# Patient Record
Sex: Male | Born: 1980 | Race: Black or African American | Hispanic: No | Marital: Single | Smoking: Never smoker
Health system: Southern US, Community
[De-identification: ages and names within clinical notes are randomized; demographics above are authoritative.]

---

## 2008-05-09 ENCOUNTER — Emergency Department: Payer: Self-pay | Admitting: Emergency Medicine

## 2010-03-22 ENCOUNTER — Emergency Department: Payer: Self-pay | Admitting: Unknown Physician Specialty

## 2010-03-27 ENCOUNTER — Emergency Department: Payer: Self-pay | Admitting: Emergency Medicine

## 2010-07-25 ENCOUNTER — Emergency Department: Payer: Self-pay | Admitting: Emergency Medicine

## 2012-06-27 DIAGNOSIS — Z3141 Encounter for fertility testing: Secondary | ICD-10-CM | POA: Insufficient documentation

## 2014-05-12 ENCOUNTER — Emergency Department: Payer: Self-pay | Admitting: Emergency Medicine
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
## 2018-11-21 ENCOUNTER — Other Ambulatory Visit: Payer: Self-pay

## 2018-11-21 DIAGNOSIS — Z20822 Contact with and (suspected) exposure to covid-19: Secondary | ICD-10-CM

## 2018-11-23 LAB — NOVEL CORONAVIRUS, NAA: SARS-CoV-2, NAA: NOT DETECTED

## 2019-01-17 ENCOUNTER — Other Ambulatory Visit: Payer: Self-pay

## 2019-01-17 DIAGNOSIS — Z20822 Contact with and (suspected) exposure to covid-19: Secondary | ICD-10-CM

## 2019-01-19 LAB — NOVEL CORONAVIRUS, NAA: SARS-CoV-2, NAA: NOT DETECTED

## 2019-01-23 ENCOUNTER — Telehealth: Payer: Self-pay

## 2019-01-23 NOTE — Telephone Encounter (Signed)
Called and informed patient that test for Covid 19 was NEGATIVE. Discussed signs and symptoms of Covid 19 : fever, chills, respiratory symptoms, cough, ENT symptoms, sore throat, SOB, muscle pain, diarrhea, headache, loss of taste/smell, close exposure to COVID-19 patient. Pt instructed to call PCP if they develop the above signs and sx. Pt also instructed to call 911 if having respiratory. Pt stated he was around someone who tested positive for Covid. Advised pt to self isolate for total if 14 days. issues/distress. Discussed MyChart enrollment. Pt verbalized understanding.

## 2021-12-27 ENCOUNTER — Ambulatory Visit
Admission: EM | Admit: 2021-12-27 | Discharge: 2021-12-27 | Disposition: A | Discharge status: Home / Self Care | Payer: BC Managed Care – PPO

## 2021-12-27 DIAGNOSIS — B084 Enteroviral vesicular stomatitis with exanthem: Secondary | ICD-10-CM

## 2021-12-27 NOTE — ED Provider Notes (Signed)
[facility]    CSN: [id number] Arrival date & time: 12/27/21  1453      History   Chief Complaint Chief Complaint  Patient presents with   Rash    HPI Shawn Miles is a 41 y.o. male presenting for red dots on his hands and feet for the past 4 days.  He also reports a mild sore throat.  No mouth sores.  No fever.  Denies congestion or cough.  He says that his son recently got diagnosed with hand-foot-and-mouth.  Patient has been on work for a few days for not feeling well and request a work note.  He has been trying to keep the rash clean and treat with over-the-counter medications.  He says that his throat is feeling better.  He has no new concerns.  No other complaints.  HPI  History reviewed. No pertinent past medical history.  There are no problems to display for this patient.   History reviewed. No pertinent surgical history.     Home Medications    Prior to Admission medications   Not on File    Family History History reviewed. No pertinent family history.  Social History Social History   Tobacco Use   Smoking status: Never   Smokeless tobacco: Never  Vaping Use   Vaping Use: Never used  Substance Use Topics   Alcohol use: Yes   Drug use: Never     Allergies   Patient has no allergy information on record.   Review of Systems Review of Systems  Constitutional:  Negative for fatigue and fever.  HENT:  Positive for sore throat. Negative for congestion, mouth sores and rhinorrhea.   Respiratory:  Negative for cough.   Musculoskeletal:  Negative for arthralgias and joint swelling.  Skin:  Positive for rash.  Neurological:  Negative for weakness.     Physical Exam Triage Vital Signs ED Triage Vitals  Enc Vitals Group     BP      Pulse      Resp      Temp      Temp src      SpO2      Weight      Height      Head Circumference      Peak Flow      Pain Score      Pain Loc      Pain Edu?      Excl. in GC?    No data  found.  Updated Vital Signs BP (!) 137/93 (BP Location: Left Arm)   Pulse 82   Temp 98 F (36.7 C) (Oral)   Resp 18   Ht 6\' 2"  (1.88 m)   Wt 250 lb (113.4 kg)   SpO2 97%   BMI 32.10 kg/m    Physical Exam Vitals and nursing note reviewed.  Constitutional:      General: He is not in acute distress.    Appearance: Normal appearance. He is well-developed. He is not ill-appearing.  HENT:     Head: Normocephalic and atraumatic.     Nose: Nose normal.     Mouth/Throat:     Mouth: Mucous membranes are moist.     Pharynx: Oropharynx is clear. Posterior oropharyngeal erythema (mild) present.  Eyes:     General: No scleral icterus.    Conjunctiva/sclera: Conjunctivae normal.  Cardiovascular:     Rate and Rhythm: Normal rate and regular rhythm.     Heart sounds: Normal heart sounds.  Pulmonary:     Effort: Pulmonary effort is normal. No respiratory distress.     Breath sounds: Normal breath sounds.  Musculoskeletal:     Cervical back: Neck supple.  Skin:    General: Skin is warm and dry.     Capillary Refill: Capillary refill takes less than 2 seconds.     Findings: Rash present.     Comments: Multiple scattered erythematous macules and papules of bilateral palmar hands and feet  Neurological:     General: No focal deficit present.     Mental Status: He is alert. Mental status is at baseline.     Motor: No weakness.     Gait: Gait normal.  Psychiatric:        Mood and Affect: Mood normal.      UC Treatments / Results  Labs (all labs ordered are listed, but only abnormal results are displayed) Labs Reviewed - No data to display  EKG   Radiology No results found.  Procedures Procedures (including critical care time)  Medications Ordered in UC Medications - No data to display  Initial Impression / Assessment and Plan / UC Course  I have reviewed the triage vital signs and the nursing notes.  Pertinent labs & imaging results that were available during my care  of the patient were reviewed by me and considered in my medical decision making (see chart for details).   41 year old male presents for rash of hands and feet for the past few days.  Also reports a sore throat.  He has been around his son who is recently diagnosed with hand-foot-and-mouth.  Vitals are stable.  He is afebrile.  On exam he has mild erythema posterior pharynx.  No mouth lesions.  He does have a few scattered erythematous papules and macules mostly of the palmar hands, some of the dorsal hands and feet.  The remainder of the exam is normal.  Suspect that he does indeed have hand-foot-and-mouth.  Reviewed supportive care.  Work note provided.  Reviewed return and ER precautions.   Final Clinical Impressions(s) / UC Diagnoses   Final diagnoses:  Hand, foot and mouth disease   Discharge Instructions   None    ED Prescriptions   None    PDMP not reviewed this encounter.   Danton Clap, PA-C 12/27/21 1621

## 2021-12-27 NOTE — ED Triage Notes (Signed)
Pt c/o hand foot and mouth x4days  Pt states he got it from his youngest son who got it from daycare.   Pt asks for a work note.

## 2022-08-10 ENCOUNTER — Ambulatory Visit
Admission: EM | Admit: 2022-08-10 | Discharge: 2022-08-10 | Disposition: A | Discharge status: Home / Self Care | Payer: BC Managed Care – PPO | Attending: Emergency Medicine | Admitting: Emergency Medicine

## 2022-08-10 DIAGNOSIS — H1033 Unspecified acute conjunctivitis, bilateral: Secondary | ICD-10-CM

## 2022-08-10 MED ORDER — MOXIFLOXACIN HCL 0.5 % OP SOLN: 1.0000 [drp] | Freq: Three times a day (TID) | OPHTHALMIC | 0 refills | Status: AC

## 2022-08-10 NOTE — Discharge Instructions (Signed)
Try OTC Opcon-A, 2 drops in each eye every 6 hours to see if they help your symptoms.  If your symptoms do not improve, or you develop mucous like discharge start the Vigamox.  Instill 1 drop of Vigamox in each eye every 8 hours for the next 7 days for treatment of your conjunctivitis.  Avoid touching your eyes as much as possible.  Wipe down all surfaces, countertops, and doorknobs after the first and second 24 hours on eyedrops.  Wash her face with a clean wash rag to remove any drainage and use a different portion of the wash rag to clean each eye so as to not reinfect yourself.  Return for reevaluation for any new or worsening symptoms.

## 2022-08-10 NOTE — ED Triage Notes (Signed)
Patient presents to UC for bilateral eye redness, light sensitivity,  and watery drainage. Treating with clear eyes, states it made it worse.

## 2022-08-10 NOTE — ED Provider Notes (Signed)
[facility]    CSN: [id number] Arrival date & time: 08/10/22  1413      History   Chief Complaint Chief Complaint  Patient presents with   Eye Problem    HPI Shawn Miles is a 42 y.o. male.   HPI  42 year old male with no significant past medical history presents for evaluation of bilateral eye redness with watery discharge that started last night with photosensitivity that started this morning.  He also reports that he had some crusting in his inner canthus but not in his eyelashes and his eyelashes were not matted shut.  He denies any injury.  He does not wear contacts.  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  History reviewed. No pertinent past medical history.  Patient Active Problem List   Diagnosis Date Noted   Fertility testing 06/27/2012    History reviewed. No pertinent surgical history.     Home Medications    Prior to Admission medications   Medication Sig Start Date End Date Taking? Authorizing Provider  moxifloxacin (VIGAMOX) 0.5 % ophthalmic solution Place 1 drop into both eyes 3 (three) times daily for 7 days. 08/10/22 08/17/22 Yes Becky Augusta, NP    Family History History reviewed. No pertinent family history.  Social History Social History   Tobacco Use   Smoking status: Never   Smokeless tobacco: Never  Vaping Use   Vaping Use: Never used  Substance Use Topics   Alcohol use: Yes   Drug use: Never     Allergies   Patient has no known allergies.   Review of Systems Review of Systems  Constitutional:  Negative for fever.  Eyes:  Positive for photophobia, pain, discharge and redness. Negative for itching and visual disturbance.     Physical Exam Triage Vital Signs ED Triage Vitals  Enc Vitals Group     BP      Pulse      Resp      Temp      Temp src      SpO2      Weight      Height      Head Circumference      Peak Flow      Pain Score      Pain Loc      Pain Edu?      Excl. in GC?    No data found.  Updated  Vital Signs BP (!) 126/102 (BP Location: Left Arm)   Pulse 91   Temp 98 F (36.7 C)   Resp 18   SpO2 99%   Visual Acuity Right Eye Distance:   Left Eye Distance:   Bilateral Distance:    Right Eye Near:   Left Eye Near:    Bilateral Near:     Physical Exam Vitals and nursing note reviewed.  Constitutional:      Appearance: Normal appearance. He is not ill-appearing.  HENT:     Head: Normocephalic and atraumatic.  Eyes:     General:        Right eye: Discharge present.        Left eye: Discharge present.    Extraocular Movements: Extraocular movements intact.     Pupils: Pupils are equal, round, and reactive to light.     Comments: Bilateral bulbar and labral conjunctiva are erythematous and injected.  Watery discharge present from both eyes.  Skin:    General: Skin is warm and dry.     Capillary Refill: Capillary  refill takes less than 2 seconds.  Neurological:     General: No focal deficit present.     Mental Status: He is alert and oriented to person, place, and time.      UC Treatments / Results  Labs (all labs ordered are listed, but only abnormal results are displayed) Labs Reviewed - No data to display  EKG   Radiology No results found.  Procedures Procedures (including critical care time)  Medications Ordered in UC Medications - No data to display  Initial Impression / Assessment and Plan / UC Course  I have reviewed the triage vital signs and the nursing notes.  Pertinent labs & imaging results that were available during my care of the patient were reviewed by me and considered in my medical decision making (see chart for details).   Patient is a pleasant, nontoxic-appearing 42 year old male presenting for evaluation of bilateral eye complaints as outlined HPI above.  On exam patient does have erythema and injection of the bilateral labial and bulbar conjunctiva.  Both pupils are equal round and reactive and EOMs intact.  He does have watery  discharge from both eyes.  His exam is consistent with conjunctivitis and given his watery discharge is most likely viral or allergic.  I have advised him to try using over-the-counter antihistamine eyedrops, Opcon-A, 2 drops in each eye every 6 hours to see if he has improvement of his symptoms.  If he does not have improvement of his symptoms, or if he develops mucopurulent discharge, he should start taking the Vigamox and instill 1 drop in each eye every 8 hours for 7 days.  Home infection control and return precautions reviewed.   Final Clinical Impressions(s) / UC Diagnoses   Final diagnoses:  Acute conjunctivitis of both eyes, unspecified acute conjunctivitis type     Discharge Instructions      Try OTC Opcon-A, 2 drops in each eye every 6 hours to see if they help your symptoms.  If your symptoms do not improve, or you develop mucous like discharge start the Vigamox.  Instill 1 drop of Vigamox in each eye every 8 hours for the next 7 days for treatment of your conjunctivitis.  Avoid touching your eyes as much as possible.  Wipe down all surfaces, countertops, and doorknobs after the first and second 24 hours on eyedrops.  Wash her face with a clean wash rag to remove any drainage and use a different portion of the wash rag to clean each eye so as to not reinfect yourself.  Return for reevaluation for any new or worsening symptoms.      ED Prescriptions     Medication Sig Dispense Auth. Provider   moxifloxacin (VIGAMOX) 0.5 % ophthalmic solution Place 1 drop into both eyes 3 (three) times daily for 7 days. 3 mL Becky Augusta, NP      PDMP not reviewed this encounter.   Becky Augusta, NP 08/10/22 1538

## 2022-12-08 ENCOUNTER — Ambulatory Visit
Admission: EM | Admit: 2022-12-08 | Discharge: 2022-12-08 | Disposition: A | Discharge status: Home / Self Care | Payer: 59 | Attending: Emergency Medicine | Admitting: Emergency Medicine

## 2022-12-08 DIAGNOSIS — I10 Essential (primary) hypertension: Secondary | ICD-10-CM

## 2022-12-08 LAB — CBC WITH DIFFERENTIAL/PLATELET
Abs Immature Granulocytes: 0.02 10*3/uL (ref 0.00–0.07)
Basophils Absolute: 0.1 10*3/uL (ref 0.0–0.1)
Basophils Relative: 1 %
Eosinophils Absolute: 0.2 10*3/uL (ref 0.0–0.5)
Eosinophils Relative: 2 %
HCT: 43.1 % (ref 39.0–52.0)
Hemoglobin: 14.7 g/dL (ref 13.0–17.0)
Immature Granulocytes: 0 %
Lymphocytes Relative: 30 %
Lymphs Abs: 2.6 10*3/uL (ref 0.7–4.0)
MCH: 30.2 pg (ref 26.0–34.0)
MCHC: 34.1 g/dL (ref 30.0–36.0)
MCV: 88.5 fL (ref 80.0–100.0)
Monocytes Absolute: 0.8 10*3/uL (ref 0.1–1.0)
Monocytes Relative: 10 %
Neutro Abs: 4.9 10*3/uL (ref 1.7–7.7)
Neutrophils Relative %: 57 %
Platelets: 265 10*3/uL (ref 150–400)
RBC: 4.87 MIL/uL (ref 4.22–5.81)
RDW: 15.2 % (ref 11.5–15.5)
WBC: 8.5 10*3/uL (ref 4.0–10.5)
nRBC: 0 % (ref 0.0–0.2)

## 2022-12-08 LAB — URINALYSIS, ROUTINE W REFLEX MICROSCOPIC
Glucose, UA: NEGATIVE mg/dL
Hgb urine dipstick: NEGATIVE
Ketones, ur: NEGATIVE mg/dL
Leukocytes,Ua: NEGATIVE
Nitrite: NEGATIVE
Protein, ur: NEGATIVE mg/dL
Specific Gravity, Urine: 1.02 (ref 1.005–1.030)
pH: 7 (ref 5.0–8.0)

## 2022-12-08 LAB — BASIC METABOLIC PANEL
Anion gap: 9 (ref 5–15)
BUN: 12 mg/dL (ref 6–20)
CO2: 24 mmol/L (ref 22–32)
Calcium: 9.4 mg/dL (ref 8.9–10.3)
Chloride: 102 mmol/L (ref 98–111)
Creatinine, Ser: 0.85 mg/dL (ref 0.61–1.24)
GFR, Estimated: >60 mL/min (ref >60)
Glucose, Bld: 95 mg/dL (ref 70–99)
Potassium: 3.9 mmol/L (ref 3.5–5.1)
Sodium: 135 mmol/L (ref 135–145)

## 2022-12-08 LAB — TSH: TSH: 1.802 u[IU]/mL (ref 0.350–4.500)

## 2022-12-08 MED ORDER — AMLODIPINE BESYLATE 5 MG PO TABS: 5.0000 mg | ORAL_TABLET | Freq: Every day | ORAL | 0 refills | Status: AC

## 2022-12-08 NOTE — ED Triage Notes (Addendum)
Patient states that he checked his Bp . BP 150/90 yesterday. No headaches or chest pain. Patient states that he went to the ED last night regarding this. He was told to follow up with PCP. No meds were given.

## 2022-12-08 NOTE — ED Provider Notes (Signed)
HPI  SUBJECTIVE:  Shawn Miles is a 42 y.o. male who presents with multiple elevated blood pressure readings at home.  He states that he took his blood pressure at the insistence of his mother, and it measured at 150/90.  He states that it decreased to 135/93 after walking today, but that it got up as high as 170/110 at home.  He denies headache, visual loss/changes, arm/leg/face numbness/tingling/weakness, chest pain, shortness of breath, tearing pain through to his back, abdominal pain, seizures, syncope, lower extremity edema, hematuria, anuria.  Denies the use of illicit drugs, OTC decongestions, weight loss or herbal supplements.  Patient tried walking with improvement in his blood pressure reading.  No aggravating factors.  Past medical history negative for hypertension, aneurysm of CVA, MI, chronic kidney disease, diabetes.  PCP: None.  Patient was seen in the ED earlier today for this, where his blood pressure was 164/104.  He declined lab work there per chart review.  Advised to follow-up with [place] [facility] for primary care within the week.  History reviewed. No pertinent past medical history.  History reviewed. No pertinent surgical history.  History reviewed. No pertinent family history.  Social History   Tobacco Use   Smoking status: Never   Smokeless tobacco: Never  Vaping Use   Vaping status: Never Used  Substance Use Topics   Alcohol use: Yes   Drug use: Never    No current facility-administered medications for this encounter.  Current Outpatient Medications:    amLODipine (NORVASC) 5 MG tablet, Take 1 tablet (5 mg total) by mouth daily., Disp: 30 tablet, Rfl: 0  No Known Allergies   ROS  As noted in HPI.   Physical Exam  BP (!) 161/104 (BP Location: Right Arm)   Pulse 82   Temp 98.4 F (36.9 C) (Oral)   Resp 20   SpO2 98%   BP Readings from Last 3 Encounters:  12/08/22 (!) 161/104  08/10/22 (!) 126/102  12/27/21 (!) 137/93      Constitutional: Well developed, well nourished, no acute distress Eyes:  EOMI, conjunctiva normal bilaterally HENT: Normocephalic, atraumatic,mucus membranes moist Respiratory: Normal inspiratory effort, lungs clear bilaterally Cardiovascular: Normal rate, regular rhythm, no murmurs rubs or gallops GI: nondistended skin: No rash, skin intact Musculoskeletal: No lower extremity edema Neurologic: Alert & oriented x 3, no focal neuro deficits Psychiatric: Speech and behavior appropriate   ED Course   Medications - No data to display  Orders Placed This Encounter  Procedures   CBC with Differential    Standing Status:   Standing    Number of Occurrences:   1   Basic metabolic panel    Standing Status:   Standing    Number of Occurrences:   1   Urinalysis, Routine w reflex microscopic -Urine, Clean Catch    Standing Status:   Standing    Number of Occurrences:   1    Order Specific Question:   Specimen Source    Answer:   Urine, Clean Catch [[id number]]   TSH    Standing Status:   Standing    Number of Occurrences:   1   Nursing Communication Please set up with a PCP prior to discharge    Please set up with a PCP prior to discharge    Standing Status:   Standing    Number of Occurrences:   1    Results for orders placed or performed during the hospital encounter of 12/08/22 (from the past 24  hour(s))  CBC with Differential     Status: None   Collection Time: 12/08/22  5:09 PM  Result Value Ref Range   WBC 8.5 4.0 - 10.5 K/uL   RBC 4.87 4.22 - 5.81 MIL/uL   Hemoglobin 14.7 13.0 - 17.0 g/dL   HCT 56.3 87.5 - 64.3 %   MCV 88.5 80.0 - 100.0 fL   MCH 30.2 26.0 - 34.0 pg   MCHC 34.1 30.0 - 36.0 g/dL   RDW 32.9 51.8 - 84.1 %   Platelets 265 150 - 400 K/uL   nRBC 0.0 0.0 - 0.2 %   Neutrophils Relative % 57 %   Neutro Abs 4.9 1.7 - 7.7 K/uL   Lymphocytes Relative 30 %   Lymphs Abs 2.6 0.7 - 4.0 K/uL   Monocytes Relative 10 %   Monocytes Absolute 0.8 0.1 - 1.0 K/uL    Eosinophils Relative 2 %   Eosinophils Absolute 0.2 0.0 - 0.5 K/uL   Basophils Relative 1 %   Basophils Absolute 0.1 0.0 - 0.1 K/uL   Immature Granulocytes 0 %   Abs Immature Granulocytes 0.02 0.00 - 0.07 K/uL  Basic metabolic panel     Status: None   Collection Time: 12/08/22  5:09 PM  Result Value Ref Range   Sodium 135 135 - 145 mmol/L   Potassium 3.9 3.5 - 5.1 mmol/L   Chloride 102 98 - 111 mmol/L   CO2 24 22 - 32 mmol/L   Glucose, Bld 95 70 - 99 mg/dL   BUN 12 6 - 20 mg/dL   Creatinine, Ser 6.60 0.61 - 1.24 mg/dL   Calcium 9.4 8.9 - 63.0 mg/dL   GFR, Estimated >16 >01 mL/min   Anion gap 9 5 - 15  Urinalysis, Routine w reflex microscopic -Urine, Clean Catch     Status: Abnormal   Collection Time: 12/08/22  5:13 PM  Result Value Ref Range   Color, Urine YELLOW YELLOW   APPearance CLEAR CLEAR   Specific Gravity, Urine 1.020 1.005 - 1.030   pH 7.0 5.0 - 8.0   Glucose, UA NEGATIVE NEGATIVE mg/dL   Hgb urine dipstick NEGATIVE NEGATIVE   Bilirubin Urine SMALL (A) NEGATIVE   Ketones, ur NEGATIVE NEGATIVE mg/dL   Protein, ur NEGATIVE NEGATIVE mg/dL   Nitrite NEGATIVE NEGATIVE   Leukocytes,Ua NEGATIVE NEGATIVE   No results found.  ED Clinical Impression  1. Essential hypertension      ED Assessment/Plan     Outside ER records reviewed.  As noted in HPI.  Pt hypertensive today.  Repeat blood pressure on my exam 146/96.  Patient has had multiple hypertensive blood pressure readings across multiple visits.  Will check CBC, BMP, TSH, UA and start him on amlodipine 5 mg daily.  Labs reviewed.  No proteinuria.  Normal platelets.  BMP normal.  TSH pending at the time of initial signing of this note.  Pt has no historical or laboratory evidence of end organ damage.  Discussed importance of lifestyle modifications as important first steps.  Will have him keep a log of his blood pressure and either have him follow-up with a PCP or will return here in 2 weeks for repeat blood  pressure measurement and medication adjustment if necessary.  Will have staff set patient up with a PCP prior to discharge.   Discussed labs, MDM, treatment plan, and plan for follow-up with patient. Discussed sn/sx that should prompt return to the ED. patient agrees with plan.   Meds ordered this  encounter  Medications   amLODipine (NORVASC) 5 MG tablet    Sig: Take 1 tablet (5 mg total) by mouth daily.    Dispense:  30 tablet    Refill:  0      *This clinic note was created using Scientist, clinical (histocompatibility and immunogenetics). Therefore, there may be occasional mistakes despite careful proofreading.  ?    Domenick Gong, MD 12/08/22 1842

## 2022-12-08 NOTE — Discharge Instructions (Signed)
Follow-up with [facility] health services or with the PCP we set you up with prior to discharge.  Decrease your salt intake. diet and exercise will lower your blood pressure significantly. It is important to keep your blood pressure under good control, as having a elevated blood pressure for prolonged periods of time significantly increases your risk of stroke, heart attacks, kidney damage, eye damage, and other problems. Get a validated blood pressure cuff that goes on your arm, not your wrist.  Measure your blood pressure once a day, preferably at the same time every day. Keep a log of this and bring it to your next doctor's appointment.  Bring your blood pressure cuff as well.  Return here in 2 weeks for blood pressure recheck if you're unable to find a primary care physician by then. Return immediately to the ER if you start having chest pain, headache, problems seeing, problems talking, problems walking, if you feel like you're about to pass out, if you do pass out, if you have a seizure, or for any other concerns.  Go to www.goodrx.com  or www.[employer]plusdrugs.com to look up your medications. This will give you a list of where you can find your prescriptions at the most affordable prices. Or ask the pharmacist what the cash price is, or if they have any other discount programs available to help make your medication more affordable. This can be less expensive than what you would pay with insurance.
